# Patient Record
Sex: Male | Born: 1990 | Race: White | Hispanic: No | Marital: Single | Smoking: Former smoker
Health system: Southern US, Community
[De-identification: ages and names within clinical notes are randomized; demographics above are authoritative.]

## PROBLEM LIST (undated history)

## (undated) DIAGNOSIS — I10 Essential (primary) hypertension: Secondary | ICD-10-CM

## (undated) DIAGNOSIS — Q613 Polycystic kidney, unspecified: Secondary | ICD-10-CM

## (undated) HISTORY — DX: Essential (primary) hypertension: I10

## (undated) HISTORY — DX: Polycystic kidney, unspecified: Q61.3

---

## 2010-07-26 ENCOUNTER — Emergency Department (HOSPITAL_BASED_OUTPATIENT_CLINIC_OR_DEPARTMENT_OTHER)
Admission: EM | Admit: 2010-07-26 | Discharge: 2010-07-26 | Disposition: A | Discharge status: Home / Self Care | Payer: No Typology Code available for payment source | Attending: Emergency Medicine | Admitting: Emergency Medicine

## 2010-07-26 ENCOUNTER — Emergency Department (INDEPENDENT_AMBULATORY_CARE_PROVIDER_SITE_OTHER): Payer: No Typology Code available for payment source

## 2010-07-26 DIAGNOSIS — M79609 Pain in unspecified limb: Secondary | ICD-10-CM | POA: Insufficient documentation

## 2010-07-26 DIAGNOSIS — Y92410 Unspecified street and highway as the place of occurrence of the external cause: Secondary | ICD-10-CM | POA: Insufficient documentation

## 2010-07-26 DIAGNOSIS — M25519 Pain in unspecified shoulder: Secondary | ICD-10-CM

## 2014-09-05 ENCOUNTER — Emergency Department (HOSPITAL_COMMUNITY): Payer: No Typology Code available for payment source

## 2014-09-05 ENCOUNTER — Encounter (HOSPITAL_COMMUNITY): Payer: Self-pay | Admitting: Radiology

## 2014-09-05 ENCOUNTER — Emergency Department (HOSPITAL_COMMUNITY)
Admission: EM | Admit: 2014-09-05 | Discharge: 2014-09-05 | Disposition: A | Discharge status: Home / Self Care | Payer: No Typology Code available for payment source | Attending: Emergency Medicine | Admitting: Emergency Medicine

## 2014-09-05 DIAGNOSIS — S80212A Abrasion, left knee, initial encounter: Secondary | ICD-10-CM | POA: Diagnosis not present

## 2014-09-05 DIAGNOSIS — S20212A Contusion of left front wall of thorax, initial encounter: Secondary | ICD-10-CM | POA: Insufficient documentation

## 2014-09-05 DIAGNOSIS — Y999 Unspecified external cause status: Secondary | ICD-10-CM | POA: Diagnosis not present

## 2014-09-05 DIAGNOSIS — Y92410 Unspecified street and highway as the place of occurrence of the external cause: Secondary | ICD-10-CM | POA: Insufficient documentation

## 2014-09-05 DIAGNOSIS — R0602 Shortness of breath: Secondary | ICD-10-CM | POA: Insufficient documentation

## 2014-09-05 DIAGNOSIS — S299XXA Unspecified injury of thorax, initial encounter: Secondary | ICD-10-CM | POA: Diagnosis present

## 2014-09-05 DIAGNOSIS — S42018A Nondisplaced fracture of sternal end of left clavicle, initial encounter for closed fracture: Secondary | ICD-10-CM | POA: Diagnosis not present

## 2014-09-05 DIAGNOSIS — Y9389 Activity, other specified: Secondary | ICD-10-CM | POA: Insufficient documentation

## 2014-09-05 DIAGNOSIS — S2220XA Unspecified fracture of sternum, initial encounter for closed fracture: Secondary | ICD-10-CM

## 2014-09-05 LAB — I-STAT CREATININE, ED: Creatinine, Ser: 1.1 mg/dL (ref 0.61–1.24)

## 2014-09-05 MED ORDER — IBUPROFEN 800 MG PO TABS: 800.0000 mg | ORAL_TABLET | Freq: Three times a day (TID) | ORAL | Status: DC

## 2014-09-05 MED ORDER — ONDANSETRON HCL 4 MG/2ML IJ SOLN
4.0000 mg | Freq: Once | INTRAMUSCULAR | Status: AC
  Administered 2014-09-05: 4 mg via INTRAVENOUS
  Filled 2014-09-05: qty 2

## 2014-09-05 MED ORDER — HYDROMORPHONE HCL 1 MG/ML IJ SOLN
1.0000 mg | Freq: Once | INTRAMUSCULAR | Status: AC
Start: 2014-09-05 — End: 2014-09-05
  Administered 2014-09-05: 1 mg via INTRAVENOUS
  Filled 2014-09-05: qty 1

## 2014-09-05 MED ORDER — METHOCARBAMOL 500 MG PO TABS
500.0000 mg | ORAL_TABLET | Freq: Two times a day (BID) | ORAL | Status: DC
Start: 2014-09-05 — End: 2023-04-01

## 2014-09-05 MED ORDER — HYDROCODONE-ACETAMINOPHEN 5-325 MG PO TABS: 2.0000 | ORAL_TABLET | Freq: Four times a day (QID) | ORAL | Status: DC | PRN

## 2014-09-05 MED ORDER — IOHEXOL 300 MG/ML  SOLN
75.0000 mL | Freq: Once | INTRAMUSCULAR | Status: AC | PRN
  Administered 2014-09-05: 100 mL via INTRAVENOUS

## 2014-09-05 MED ORDER — MORPHINE SULFATE 4 MG/ML IJ SOLN
4.0000 mg | Freq: Once | INTRAMUSCULAR | Status: AC
  Administered 2014-09-05: 4 mg via INTRAVENOUS
  Filled 2014-09-05: qty 1
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
## 2014-09-05 NOTE — ED Notes (Signed)
This RN called CT to determine when pt will be transported to CT and was informed a transporter is on the way to take pt to the CT scanner.
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
## 2019-08-11 ENCOUNTER — Emergency Department
Admission: EM | Admit: 2019-08-11 | Discharge: 2019-08-11 | Disposition: A | Discharge status: Home / Self Care | Payer: Self-pay | Source: Home / Self Care

## 2019-08-11 ENCOUNTER — Other Ambulatory Visit: Payer: Self-pay

## 2019-08-11 DIAGNOSIS — W57XXXA Bitten or stung by nonvenomous insect and other nonvenomous arthropods, initial encounter: Secondary | ICD-10-CM

## 2019-08-11 DIAGNOSIS — S30861A Insect bite (nonvenomous) of abdominal wall, initial encounter: Secondary | ICD-10-CM

## 2019-08-11 DIAGNOSIS — A6920 Lyme disease, unspecified: Secondary | ICD-10-CM

## 2019-08-11 MED ORDER — DOXYCYCLINE HYCLATE 100 MG PO CAPS: 100.0000 mg | ORAL_CAPSULE | Freq: Two times a day (BID) | ORAL | 0 refills | Status: AC

## 2019-08-11 NOTE — ED Triage Notes (Signed)
Patient presents to Urgent Care with complaints of multiple tick bites since the past few days. Patient reports the bites are itchy, read that the symptoms for lymes disease can get worse the longer you "have it in you".
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
## 2019-08-11 NOTE — Discharge Instructions (Signed)
  Please take antibiotics as prescribed and be sure to complete entire course even if you start to feel better to ensure infection does not come back.  Please follow up with a primary care provider as needed.

## 2019-08-13 ENCOUNTER — Encounter (HOSPITAL_COMMUNITY): Payer: Self-pay

## 2019-08-13 ENCOUNTER — Ambulatory Visit (HOSPITAL_COMMUNITY)
Admission: EM | Admit: 2019-08-13 | Discharge: 2019-08-13 | Disposition: A | Discharge status: Home / Self Care | Payer: Self-pay | Attending: Physician Assistant | Admitting: Physician Assistant

## 2019-08-13 ENCOUNTER — Other Ambulatory Visit: Payer: Self-pay

## 2019-08-13 DIAGNOSIS — S61411A Laceration without foreign body of right hand, initial encounter: Secondary | ICD-10-CM

## 2019-08-13 DIAGNOSIS — W540XXA Bitten by dog, initial encounter: Secondary | ICD-10-CM

## 2019-08-13 MED ORDER — AMOXICILLIN-POT CLAVULANATE 875-125 MG PO TABS: 1.0000 | ORAL_TABLET | Freq: Two times a day (BID) | ORAL | 0 refills | Status: AC

## 2019-08-13 NOTE — ED Provider Notes (Signed)
[place]    CSN: [id number] Arrival date & time: 08/13/19  1709      History   Chief Complaint Chief Complaint  Patient presents with  . Animal Bite    HPI Leslie Levine is a 29 y.o. male.   Patient presents for evaluation of dog bite.  Is been on the right hand today.  Reports the dog is up-to-date on his rabies vaccination.  Denies significant pain at this time.  He does not have an updated tetanus shot however is concerned about cost.     History reviewed. No pertinent past medical history.  There are no problems to display for this patient.   History reviewed. No pertinent surgical history.     Home Medications    Prior to Admission medications   Medication Sig Start Date End Date Taking? Authorizing Provider  amoxicillin-clavulanate (AUGMENTIN) 875-125 MG tablet Take 1 tablet by mouth 2 (two) times daily for 5 days. 08/13/19 08/18/19  Corynne Scibilia, Marguerita Beards, PA-C  doxycycline (VIBRAMYCIN) 100 MG capsule Take 1 capsule (100 mg total) by mouth 2 (two) times daily for 14 days. 08/11/19 08/25/19  Noe Gens, PA-C  HYDROcodone-acetaminophen (NORCO/VICODIN) 5-325 MG per tablet Take 2 tablets by mouth every 6 (six) hours as needed for severe pain. 09/05/14   Domenic Moras, PA-C  ibuprofen (ADVIL,MOTRIN) 800 MG tablet Take 1 tablet (800 mg total) by mouth 3 (three) times daily. 09/05/14   Domenic Moras, PA-C  methocarbamol (ROBAXIN) 500 MG tablet Take 1 tablet (500 mg total) by mouth 2 (two) times daily. 09/05/14   Domenic Moras, PA-C    Family History Family History  Problem Relation Age of Onset  . Healthy Mother   . Healthy Father     Social History Social History   Tobacco Use  . Smoking status: Former Smoker    Types: Cigarettes    Quit date: 08/10/2016    Years since quitting: 3.0  . Smokeless tobacco: Never Used  Substance Use Topics  . Alcohol use: Not Currently  . Drug use: Not on file     Allergies   Sulfa antibiotics   Review of Systems Review  of Systems   Physical Exam Triage Vital Signs ED Triage Vitals  Enc Vitals Group     BP 08/13/19 1730 (!) 148/74     Pulse Rate 08/13/19 1730 68     Resp 08/13/19 1730 18     Temp 08/13/19 1730 98.2 F (36.8 C)     Temp Source 08/13/19 1730 Oral     SpO2 08/13/19 1730 100 %     Weight 08/13/19 1731 195 lb (88.5 kg)     Height --      Head Circumference --      Peak Flow --      Pain Score 08/13/19 1731 5     Pain Loc --      Pain Edu? --      Excl. in [place]? --    No data found.  Updated Vital Signs BP (!) 148/74 (BP Location: Right Arm)   Pulse 68   Temp 98.2 F (36.8 C) (Oral)   Resp 18   Wt 195 lb (88.5 kg)   SpO2 100%   Visual Acuity Right Eye Distance:   Left Eye Distance:   Bilateral Distance:    Right Eye Near:   Left Eye Near:    Bilateral Near:     Physical Exam Vitals and nursing note reviewed.  Constitutional:  General: He is not in acute distress.    Appearance: Normal appearance. He is not ill-appearing.  Skin:    General: Skin is warm and dry.     Comments: 1 cm puncture laceration between the fourth and fifth metacarpals on the right hand.  No bony tenderness.  Or significant swelling.  No other lacerations.  Neurological:     Mental Status: He is alert.      UC Treatments / Results  Labs (all labs ordered are listed, but only abnormal results are displayed) Labs Reviewed - No data to display  EKG   Radiology No results found.  Procedures Procedures (including critical care time)  Medications Ordered in UC Medications - No data to display  Initial Impression / Assessment and Plan / UC Course  I have reviewed the triage vital signs and the nursing notes.  Pertinent labs & imaging results that were available during my care of the patient were reviewed by me and considered in my medical decision making (see chart for details).     #Dog bite #Laceration right hand Patient is a 29 year old presenting with dog bite injury to  right hand.  None appear to be bony injury.  Will defer imaging.  Patient wished to defer tetanus booster due to cost, recommended follow-up with health department.  Patient is already on doxycycline from a visit 2 days ago for concern of tickborne illness.  Will start on Augmentin 5 days per prophylaxis.  Wound cleaned and bandaged in clinic.  Patient recommended to follow-up in 3 days for wound check however he states he will be out of town for work.  Strict return precautions and signs of infection were discussed.  He verbalized understanding. Final Clinical Impressions(s) / UC Diagnoses   Final diagnoses:  Dog bite, initial encounter  Laceration of right hand, foreign body presence unspecified, initial encounter     Discharge Instructions     Take Augmentin twice a day for 5 days Monitor for any and all signs of infection these include spreading redness, worsening swelling significantly worsening pain.  Fevers and chills.  If you notice any of these you need to be reevaluated  I recommend being reevaluated in 3 to 4 days doing sure no infection and good healing.  You should have your tetanus shot updated, I believe this can be done free at the health department.     ED Prescriptions    Medication Sig Dispense Auth. Provider   amoxicillin-clavulanate (AUGMENTIN) 875-125 MG tablet Take 1 tablet by mouth 2 (two) times daily for 5 days. 10 tablet Oswald Pott, Veryl Speak, PA-C     PDMP not reviewed this encounter.   Hermelinda Medicus, PA-C 08/13/19 1845

## 2019-08-13 NOTE — ED Triage Notes (Signed)
Pt states he was bitten by a dog. The dog has his rabies shots already. Pt states the  dog is being kept by his mother for a friend.

## 2019-08-13 NOTE — Discharge Instructions (Signed)
Take Augmentin twice a day for 5 days Monitor for any and all signs of infection these include spreading redness, worsening swelling significantly worsening pain.  Fevers and chills.  If you notice any of these you need to be reevaluated  I recommend being reevaluated in 3 to 4 days doing sure no infection and good healing.  You should have your tetanus shot updated, I believe this can be done free at the health department.

## 2021-12-04 DIAGNOSIS — Q613 Polycystic kidney, unspecified: Secondary | ICD-10-CM | POA: Insufficient documentation

## 2021-12-04 DIAGNOSIS — N281 Cyst of kidney, acquired: Secondary | ICD-10-CM | POA: Insufficient documentation

## 2023-04-01 ENCOUNTER — Encounter: Payer: Self-pay | Admitting: Medical-Surgical

## 2023-04-01 ENCOUNTER — Ambulatory Visit (INDEPENDENT_AMBULATORY_CARE_PROVIDER_SITE_OTHER): Payer: Self-pay | Admitting: Medical-Surgical

## 2023-04-01 VITALS — BP 136/79 | HR 77 | Resp 20 | Ht 75.0 in | Wt 249.5 lb

## 2023-04-01 DIAGNOSIS — Q613 Polycystic kidney, unspecified: Secondary | ICD-10-CM

## 2023-04-01 DIAGNOSIS — I150 Renovascular hypertension: Secondary | ICD-10-CM

## 2023-04-01 DIAGNOSIS — B001 Herpesviral vesicular dermatitis: Secondary | ICD-10-CM

## 2023-04-01 DIAGNOSIS — Z7689 Persons encountering health services in other specified circumstances: Secondary | ICD-10-CM

## 2023-04-01 MED ORDER — VALACYCLOVIR HCL 1 G PO TABS: 2000.0000 mg | ORAL_TABLET | Freq: Two times a day (BID) | ORAL | 3 refills | Status: DC

## 2023-04-01 NOTE — Progress Notes (Signed)
New Patient Office Visit  Subjective:  Patient ID: ELIASON Levine, male    DOB: 1990-08-09  Age: 32 y.o. MRN: [id number]  CC:  Chief Complaint  Patient presents with   Establish Care   HPI Leslie Levine presents to establish care.  He is a very pleasant 32 year old male who previously sought care at [facility] but his doctor moved out of town.  He was diagnosed with polycystic kidney disease in 2023.  He was referred to nephrology however he was unable to follow-up completely due to loss of insurance and financial burden.  Reports that he is urinating fine and working to stay well-hydrated.  Has worked very hard to change his lifestyle around with a focus on healthy living.  Last kidney function checked in September 2023 with mildly elevated creatinine.  At the time of polycystic kidney disease diagnosis, he was also diagnosed with hypertension which he has found runs hand-in-hand with kidney issues.  He wanted to avoid taking medication for blood pressure management so cut out sodium in his diet and stopped vaping.  He is using nicotine pouches but has found that low-sodium diet and vaping cessation has brought his blood pressure down to a more normal level.  He has a blood pressure cuff that measures on the wrist that he got from a local pharmacy however he has not been able to get that to provide reliable readings.  Denies any concerning symptoms today.  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  He is requesting a letter on letterhead today stating that his blood pressure is now controlled so that they will proceed with his DOT physical.  He has a history of cold sores that used to pop up about once every 3 to 6 months however he was able to go a year without 1.  Unfortunately, shortly after this he developed cold sores that were occurring every few weeks instead of months.  Previously used Valtrex which worked Adult nurse to treat the cold sores but has been out for a while.   Requesting refill.  Past Medical History:  Diagnosis Date   Hypertension    PKD (polycystic kidney disease)     No past surgical history on file.  Family History  Problem Relation Age of Onset   Diabetes Mother    Prostate cancer Father     Social History   Socioeconomic History   Marital status: Single    Spouse name: Not on file   Number of children: Not on file   Years of education: Not on file   Highest education level: Not on file  Occupational History   Not on file  Tobacco Use   Smoking status: Former    Current packs/day: 0.00    Types: Cigarettes    Quit date: 08/10/2016    Years since quitting: 6.6   Smokeless tobacco: Never  Vaping Use   Vaping status: Former   Substances: Nicotine, Flavoring  Substance and Sexual Activity   Alcohol use: Not Currently   Drug use: Not Currently    Types: Marijuana   Sexual activity: Yes    Birth control/protection: Condom  Other Topics Concern   Not on file  Social History Narrative   Not on file   Social Drivers of Health   Financial Resource Strain: Not on file  Food Insecurity: No Food Insecurity (12/04/2021)   [facility]   Hunger Vital Sign    Worried About Running Out  of Food in the Last Year: Never true    Ran Out of Food in the Last Year: Never true  Transportation Needs: Not on file  Physical Activity: Not on file  Stress: Not on file  Social Connections: Unknown (11/13/2021)   Received from [employer] [facility] [employer]   Social Network    Social Network: Not on file  Intimate Partner Violence: Unknown (11/13/2021)   Received from [facility]   HITS    Physically Hurt: Not on file    Insult or Talk Down To: Not on file    Threaten Physical Harm: Not on file    Scream or Curse: Not on file    ROS Review of Systems  Constitutional:  Negative for chills, fatigue, fever and unexpected weight change.  HENT:  Negative for congestion, rhinorrhea, sinus pressure and sore throat.   Eyes:  Negative  for visual disturbance.  Respiratory:  Negative for cough, chest tightness, shortness of breath and wheezing.   Cardiovascular:  Negative for chest pain, palpitations and leg swelling.  Gastrointestinal:  Negative for abdominal pain, constipation, diarrhea, nausea and vomiting.  Genitourinary:  Negative for dysuria, frequency and urgency.  Skin:  Negative for rash.  Neurological:  Negative for dizziness, light-headedness and headaches.  Psychiatric/Behavioral:  Negative for dysphoric mood, self-injury, sleep disturbance and suicidal ideas. The patient is not nervous/anxious.     Objective:   Today's Vitals: BP 136/79 (BP Location: Left Arm, Cuff Size: Normal)   Pulse 77   Resp 20   Ht 6\' 3"  (1.905 m)   Wt 249 lb 8 oz (113.2 kg)   SpO2 99%   BMI 31.19 kg/m   Physical Exam Vitals and nursing note reviewed.  Constitutional:      General: He is not in acute distress.    Appearance: Normal appearance.  HENT:     Head: Normocephalic and atraumatic.  Cardiovascular:     Rate and Rhythm: Normal rate and regular rhythm.     Pulses: Normal pulses.  Pulmonary:     Effort: Pulmonary effort is normal. No respiratory distress.  Skin:    General: Skin is warm and dry.  Neurological:     Mental Status: He is alert and oriented to person, place, and time.  Psychiatric:        Mood and Affect: Mood normal.        Behavior: Behavior normal.        Thought Content: Thought content normal.        Judgment: Judgment normal.     Assessment & Plan:   1. Encounter to establish care (Primary) Reviewed available information and discussed care concerns with patient.   2. Cold sore Restart Valtrex 2000 mg twice daily for 1 day as needed for cold sores.  3. Polycystic kidney disease Checking BMP today.  Ultimately would like to get him back with nephrology for full testing however cost is certainly a concern.  If kidney function holding steady, okay to defer this until he has an option for  better insurance coverage. - Basic Metabolic Panel (BMET)  4. Renovascular hypertension Currently diet/lifestyle controlled.  Letter written on our letterhead and provided to the patient with clearance for DOT physical now that his blood pressure is controlled.   Outpatient Encounter Medications as of 04/01/2023  Medication Sig   valACYclovir (VALTREX) 1000 MG tablet Take 2 tablets (2,000 mg total) by mouth 2 (two) times daily for 1 day. As needed for cold sore outbreak   [DISCONTINUED] HYDROcodone-acetaminophen (  NORCO/VICODIN) 5-325 MG per tablet Take 2 tablets by mouth every 6 (six) hours as needed for severe pain.   [DISCONTINUED] ibuprofen (ADVIL,MOTRIN) 800 MG tablet Take 1 tablet (800 mg total) by mouth 3 (three) times daily.   [DISCONTINUED] methocarbamol (ROBAXIN) 500 MG tablet Take 1 tablet (500 mg total) by mouth 2 (two) times daily.   No facility-administered encounter medications on file as of 04/01/2023.    Follow-up: Return in about 6 months (around 09/30/2023) for PCKD/HTN follow up.   Thayer Ohm, DNP, APRN, FNP-BC [facility] and Sports Medicine

## 2023-04-02 LAB — BASIC METABOLIC PANEL
BUN/Creatinine Ratio: 18 (ref 9–20)
BUN: 28 mg/dL — ABNORMAL HIGH (ref 6–20)
CO2: 22 mmol/L (ref 20–29)
Calcium: 9.3 mg/dL (ref 8.7–10.2)
Chloride: 101 mmol/L (ref 96–106)
Creatinine, Ser: 1.52 mg/dL — ABNORMAL HIGH (ref 0.76–1.27)
Glucose: 84 mg/dL (ref 70–99)
Potassium: 4.6 mmol/L (ref 3.5–5.2)
Sodium: 137 mmol/L (ref 134–144)
eGFR: 61 mL/min/{1.73_m2} (ref >59)

## 2023-12-30 ENCOUNTER — Ambulatory Visit: Payer: Self-pay | Admitting: Medical-Surgical

## 2023-12-30 ENCOUNTER — Encounter: Payer: Self-pay | Admitting: Medical-Surgical

## 2023-12-30 ENCOUNTER — Ambulatory Visit

## 2023-12-30 VITALS — BP 113/76 | HR 67 | Ht 75.0 in | Wt 225.0 lb

## 2023-12-30 DIAGNOSIS — I150 Renovascular hypertension: Secondary | ICD-10-CM | POA: Diagnosis not present

## 2023-12-30 DIAGNOSIS — M2011 Hallux valgus (acquired), right foot: Secondary | ICD-10-CM | POA: Diagnosis not present

## 2023-12-30 DIAGNOSIS — M25521 Pain in right elbow: Secondary | ICD-10-CM

## 2023-12-30 DIAGNOSIS — M79671 Pain in right foot: Secondary | ICD-10-CM | POA: Diagnosis not present

## 2023-12-30 DIAGNOSIS — Z789 Other specified health status: Secondary | ICD-10-CM

## 2023-12-30 DIAGNOSIS — N281 Cyst of kidney, acquired: Secondary | ICD-10-CM | POA: Diagnosis not present

## 2023-12-30 DIAGNOSIS — N50811 Right testicular pain: Secondary | ICD-10-CM

## 2023-12-30 DIAGNOSIS — I10 Essential (primary) hypertension: Secondary | ICD-10-CM | POA: Diagnosis not present

## 2023-12-30 DIAGNOSIS — M25522 Pain in left elbow: Secondary | ICD-10-CM

## 2023-12-30 DIAGNOSIS — F17290 Nicotine dependence, other tobacco product, uncomplicated: Secondary | ICD-10-CM

## 2023-12-30 DIAGNOSIS — N50812 Left testicular pain: Secondary | ICD-10-CM

## 2023-12-30 DIAGNOSIS — Z0000 Encounter for general adult medical examination without abnormal findings: Secondary | ICD-10-CM

## 2023-12-30 DIAGNOSIS — L603 Nail dystrophy: Secondary | ICD-10-CM | POA: Insufficient documentation

## 2023-12-30 DIAGNOSIS — Q613 Polycystic kidney, unspecified: Secondary | ICD-10-CM | POA: Diagnosis not present

## 2023-12-30 DIAGNOSIS — A64 Unspecified sexually transmitted disease: Secondary | ICD-10-CM

## 2023-12-30 LAB — POCT URINALYSIS DIP (CLINITEK)
Bilirubin, UA: NEGATIVE
Blood, UA: NEGATIVE
Glucose, UA: NEGATIVE mg/dL
Ketones, POC UA: NEGATIVE mg/dL
Leukocytes, UA: NEGATIVE
Nitrite, UA: NEGATIVE
POC PROTEIN,UA: NEGATIVE
Spec Grav, UA: 1.015 (ref 1.010–1.025)
Urobilinogen, UA: 0.2 U/dL
pH, UA: 5.5 (ref 5.0–8.0)

## 2023-12-30 NOTE — Progress Notes (Signed)
 Established patient visit   History of Present Illness   Discussed the use of AI scribe software for clinical note transcription with the patient, who gave verbal consent to proceed.  History of Present Illness   Leslie Levine is a 33 year old male with polycystic kidney disease who presents for evaluation of protein leakage and weight loss.  Unintentional weight loss and dietary changes - Significant weight loss attributed to reduced caloric intake, currently consuming 1200-1500 calories daily - Diet consists of yogurts and protein powder in the mornings, a meat protein in the afternoon, and a normal dinner - Maintains energy and strength despite weight loss - Concerned about potential muscle mass loss  Proteinuria concerns in polycystic kidney disease - Concerned about protein leakage due to polycystic kidney disease - Interested in determining if proteinuria is present and its impact on muscle growth  Musculoskeletal pain and weakness - Left elbow injury from nine months ago causing burning pain during pull-ups and occasional swelling - Right foot injury resulting in changes in the great toe similar to a bunion - Great toe puts pressure on the second toe causing it to hurt  Testicular pain and sensitivity - Testicular pain described as a squeezing sensation - Occasional post-ejaculation itch - Increased sensitivity on one side of the testicle with pain during certain sexual activities - Testicular examination two years ago was normal  Hypertension and nicotine use - Previous blood pressure readings of 180-190 mmHg, now improved to 113/76 mmHg after quitting vaping - Currently using 2 mg nicotine pouches, with plans to quit completely       Physical Exam   Physical Exam Vitals and nursing note reviewed.  Constitutional:      General: He is not in acute distress.    Appearance: Normal appearance. He is not ill-appearing.  HENT:     Head: Normocephalic and  atraumatic.  Cardiovascular:     Rate and Rhythm: Normal rate and regular rhythm.     Pulses: Normal pulses.     Heart sounds: Normal heart sounds. No murmur heard.    No friction rub. No gallop.  Pulmonary:     Effort: Pulmonary effort is normal. No respiratory distress.     Breath sounds: Normal breath sounds.  Musculoskeletal:     Left elbow: Normal.     Right foot: Deformity (2nd toe hammertoe) and bunion present.  Skin:    General: Skin is warm and dry.  Neurological:     Mental Status: He is alert and oriented to person, place, and time.  Psychiatric:        Mood and Affect: Mood normal.        Behavior: Behavior normal.        Thought Content: Thought content normal.        Judgment: Judgment normal.    Assessment & Plan   Assessment and Plan    Healthcare maintenance Recent weight loss and dietary changes. Updating labs today - Continue current dietary practices with focus on protein intake. - Increase caloric intake by 500 calories for muscle building. - Consider high fiber, high calorie foods like beans, rice, and lentils. - Checking CBC, CMP, and Lipids  Testicular pain Intermittent testicular pain with potential causes including spermatocele, hydrocele, or varicocele. - Order scrotal ultrasound to evaluate testicular pain.  Polycystic kidney disease Polycystic kidney disease with concern for proteinuria. Discussed potential impact of nicotine on kidney function and protein leakage. - Urinalysis negative  for proteinuria. - Discuss nicotine cessation strategies. - Managed by nephrology.   Nicotine dependence, in partial remission Nicotine dependence in remission with reduced intake and plan to quit completely. Discussed impact of nicotine on kidney function. - Continue nicotine reduction plan. - Support complete cessation of nicotine use.  Renovascular hypertension, controlled Hypertension now well-controlled with lifestyle changes. Blood pressure at 113/76  mmHg. - Reinforce lifestyle modifications to maintain blood pressure control.  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 - Order x-ray of left elbow to assess for structural damage.  Right foot pain and deformity, chronic post-traumatic Chronic right foot pain and deformity with possible tendon or structural damage. - Order x-ray of right foot to assess for structural damage. - Consider referral to podiatry or orthopedics if needed.  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 - Continue use of nail strengthener. - Monitor for changes or worsening of nail condition.  General Health Maintenance Discussed hepatitis B booster due to low antibody levels. 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 - Determine appropriate hepatitis B booster schedule.  Follow up   Return in about 1 year (around 12/29/2024) for annual physical exam or sooner if needed. __________________________________ Zada FREDRIK Palin, DNP, APRN, FNP-BC Primary Care and Sports Medicine [facility] [place]

## 2023-12-31 LAB — CBC
Hematocrit: 48.5 % (ref 37.5–51.0)
Hemoglobin: 16.2 g/dL (ref 13.0–17.7)
MCH: 30.6 pg (ref 26.6–33.0)
MCHC: 33.4 g/dL (ref 31.5–35.7)
MCV: 92 fL (ref 79–97)
Platelets: 254 x10E3/uL (ref 150–450)
RBC: 5.3 x10E6/uL (ref 4.14–5.80)
RDW: 13.1 % (ref 11.6–15.4)
WBC: 6.3 x10E3/uL (ref 3.4–10.8)

## 2023-12-31 LAB — LIPID PANEL
Chol/HDL Ratio: 2.9 ratio (ref 0.0–5.0)
Cholesterol, Total: 163 mg/dL (ref 100–199)
HDL: 57 mg/dL (ref >39)
LDL Chol Calc (NIH): 94 mg/dL (ref 0–99)
Triglycerides: 62 mg/dL (ref 0–149)
VLDL Cholesterol Cal: 12 mg/dL (ref 5–40)

## 2023-12-31 LAB — CMP14+EGFR
ALT: 29 IU/L (ref 0–44)
AST: 28 IU/L (ref 0–40)
Albumin: 4.5 g/dL (ref 4.1–5.1)
Alkaline Phosphatase: 74 IU/L (ref 47–123)
BUN/Creatinine Ratio: 28 — ABNORMAL HIGH (ref 9–20)
BUN: 30 mg/dL — ABNORMAL HIGH (ref 6–20)
Bilirubin Total: 0.5 mg/dL (ref 0.0–1.2)
CO2: 19 mmol/L — ABNORMAL LOW (ref 20–29)
Calcium: 9.4 mg/dL (ref 8.7–10.2)
Chloride: 104 mmol/L (ref 96–106)
Creatinine, Ser: 1.08 mg/dL (ref 0.76–1.27)
Globulin, Total: 2.1 g/dL (ref 1.5–4.5)
Glucose: 87 mg/dL (ref 70–99)
Potassium: 4.6 mmol/L (ref 3.5–5.2)
Sodium: 138 mmol/L (ref 134–144)
Total Protein: 6.6 g/dL (ref 6.0–8.5)
eGFR: 92 mL/min/1.73 (ref >59)

## 2023-12-31 LAB — CA 125: Cancer Antigen (CA) 125: 10.9 U/mL

## 2024-01-10 NOTE — Telephone Encounter (Signed)
 Referral placed.

## 2024-01-27 ENCOUNTER — Other Ambulatory Visit: Payer: Self-pay

## 2024-01-27 ENCOUNTER — Encounter: Payer: Self-pay | Admitting: Medical-Surgical

## 2024-01-27 MED ORDER — VALACYCLOVIR HCL 1 G PO TABS: 2000.0000 mg | ORAL_TABLET | Freq: Two times a day (BID) | ORAL | 3 refills | Status: AC

## 2024-01-27 NOTE — Telephone Encounter (Signed)
 Copied from CRM [id number]. Topic: Clinical - Medication Question >> Jan 27, 2024  2:36 PM Emylou G wrote: Reason for CRM: Patient called.. would like his cold sore medication filled.. I don't see it on his list.. said he is about to be married in 3 days.  Pls review if can be filled.. He does use the [facility]

## 2024-01-27 NOTE — Telephone Encounter (Signed)
 Patient requesting rx rf of medication for cold sores.  No current medication showing in patient list Pended  Valacyclovir  from patient past medlist Last written 04/01/2023 Last OV 12/30/2023 Upcoming appt 01/01/2025 physical

## 2025-01-01 ENCOUNTER — Encounter: Admitting: Medical-Surgical
